# Patient Record
Sex: Male | Born: 1980 | Race: Black or African American | Hispanic: No | Marital: Married | Smoking: Never smoker
Health system: Southern US, Community
[De-identification: ages and names within clinical notes are randomized; demographics above are authoritative.]

## PROBLEM LIST (undated history)

## (undated) DIAGNOSIS — I10 Essential (primary) hypertension: Secondary | ICD-10-CM

---
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
## 2009-06-09 ENCOUNTER — Emergency Department: Payer: Self-pay | Admitting: Emergency Medicine

## 2010-10-09 ENCOUNTER — Emergency Department: Payer: Self-pay | Admitting: Emergency Medicine

## 2012-02-04 IMAGING — US US PELVIS LIMITED
1 series · 17 of 25 positions shown · non-contrast
Comparison: None

REASON FOR EXAM: pain right testicle,  swelling noted  right  scrotal area
COMMENTS:

PROCEDURE:     US  - US TESTICULAR  - June 09, 2009  [DATE]
RESULT:     Indication: Right testicular swelling
TECHNIQUE: Multiple gray-scale, color-flow Doppler, and spectral waveform
tracings of the testicles and testicular vasculature are presented for
review.

[Series 1: us pelvis limited · 17 of 83 slices shown]
[im 1/83]
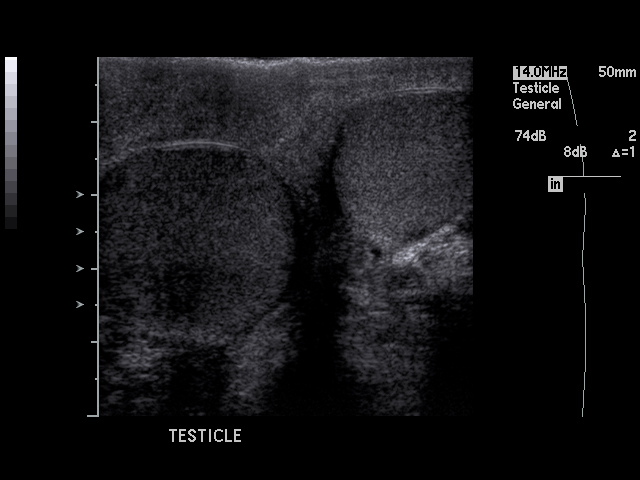
[im 7/83]
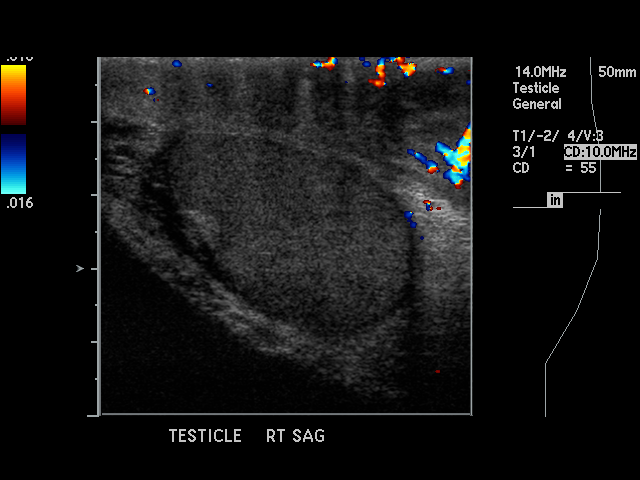
[im 11/83]
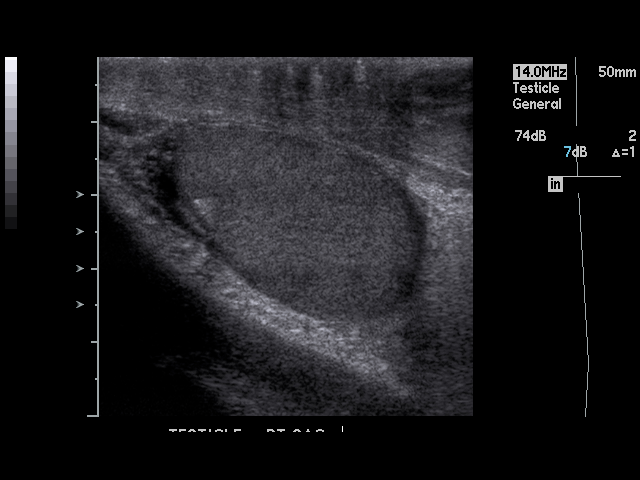
[im 18/83]
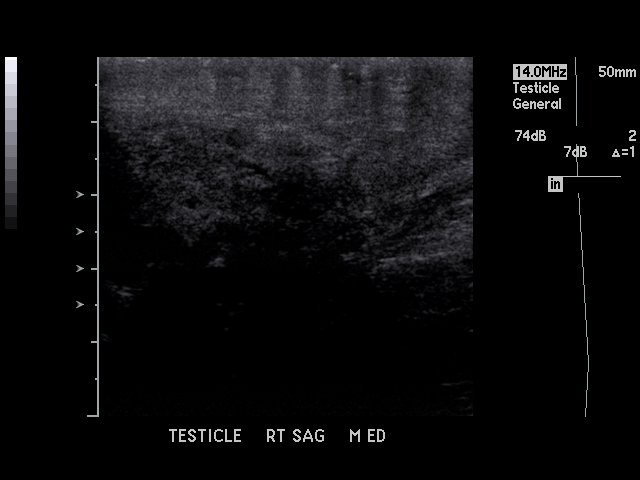
[im 21/83]
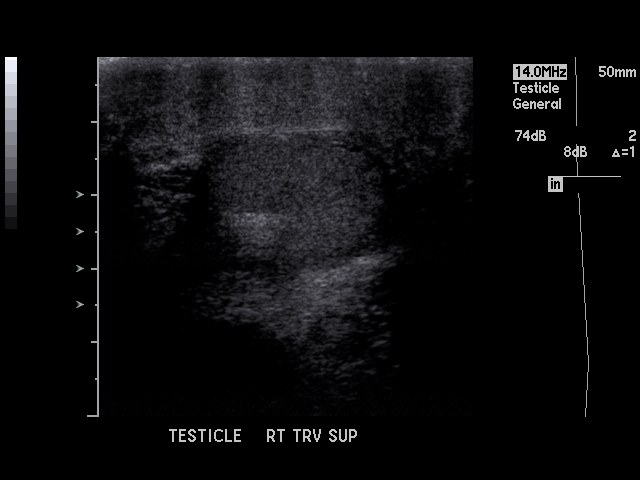
[im 28/83]
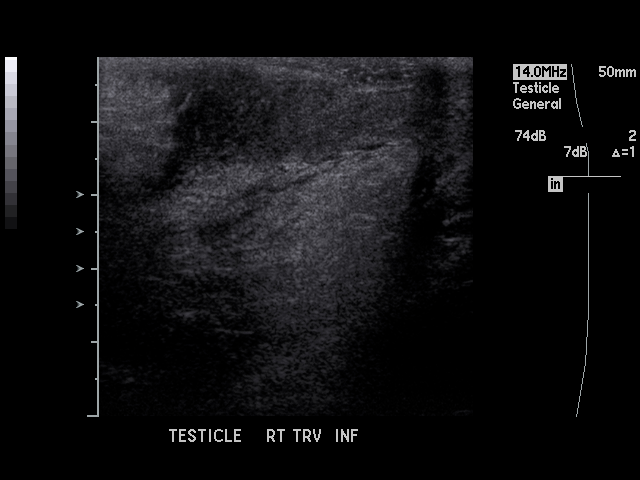
[im 31/83]
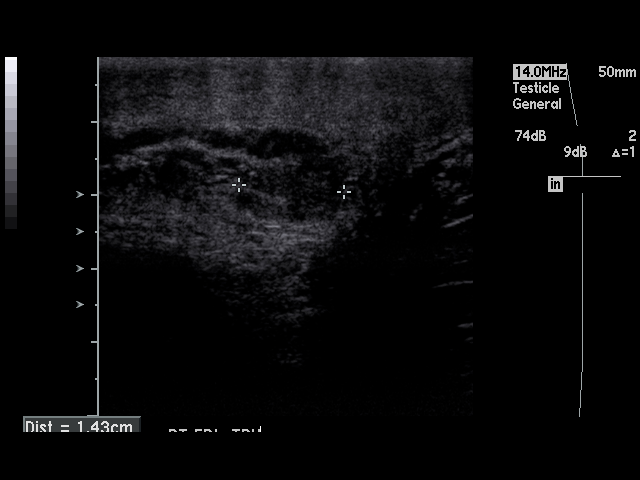
[im 38/83]
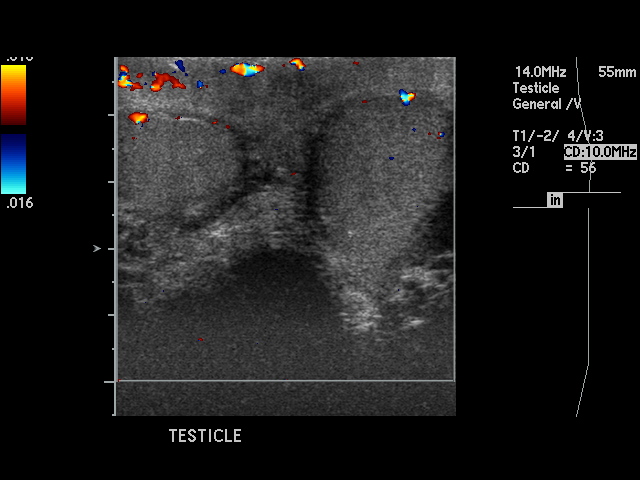
[im 42/83]
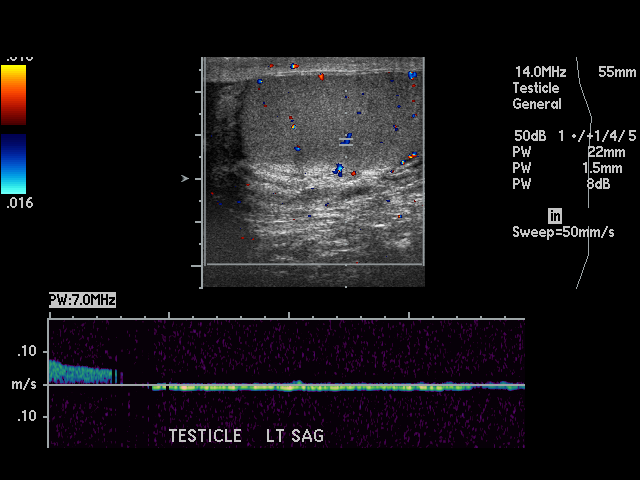
[im 45/83]
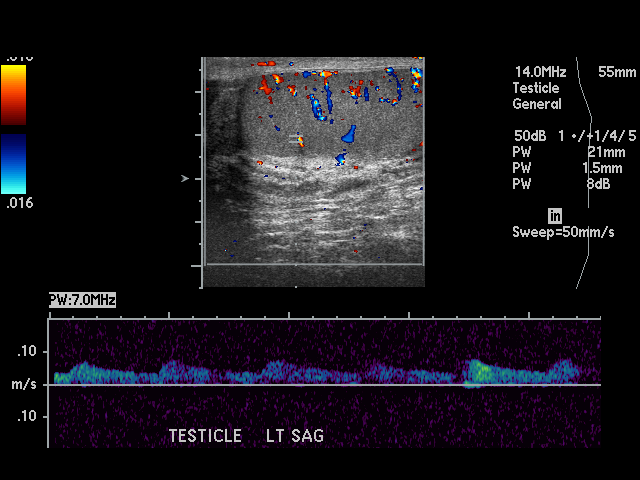
[im 52/83]
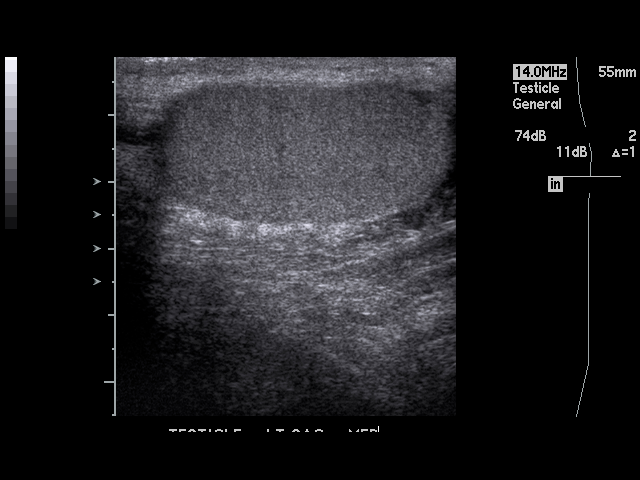
[im 55/83]
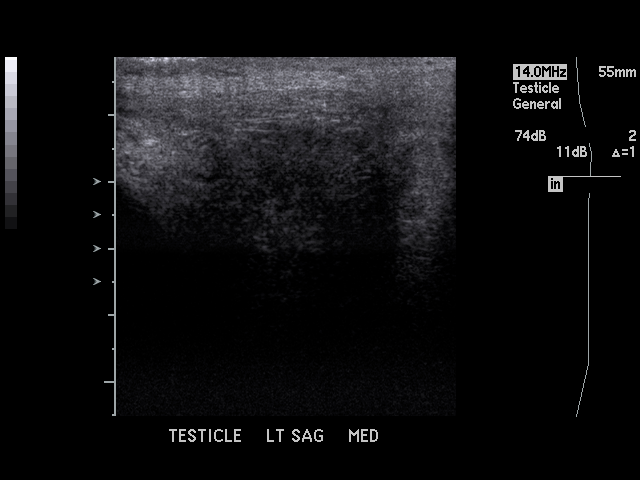
[im 62/83]
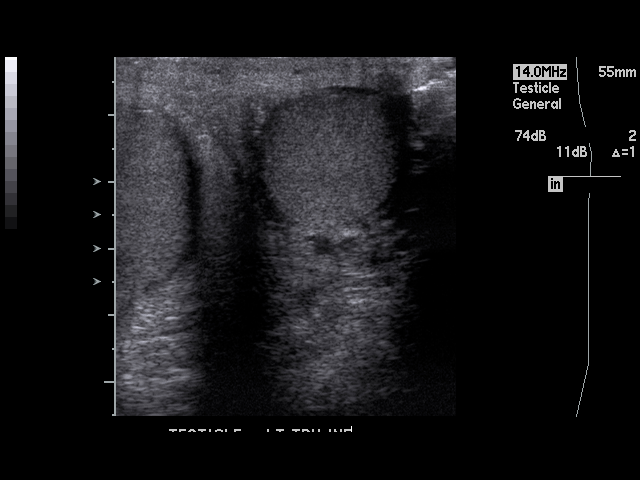
[im 65/83]
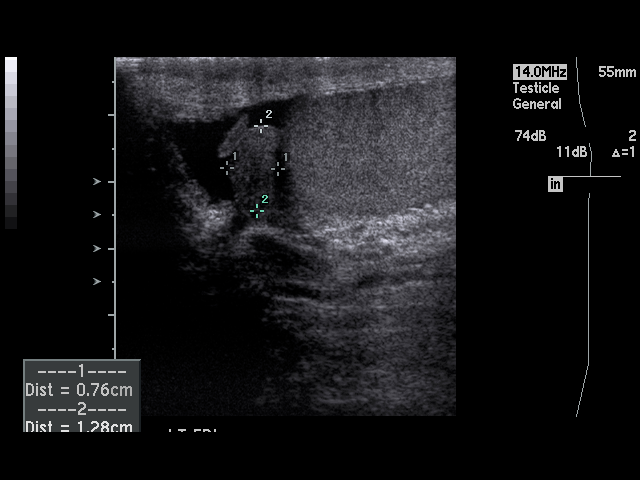
[im 72/83]
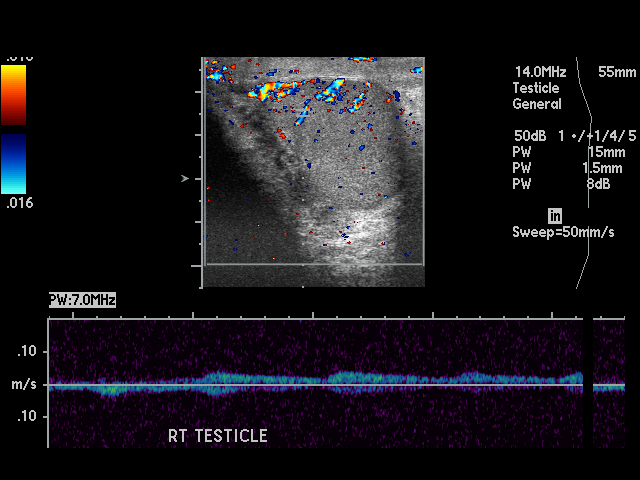
[im 76/83]
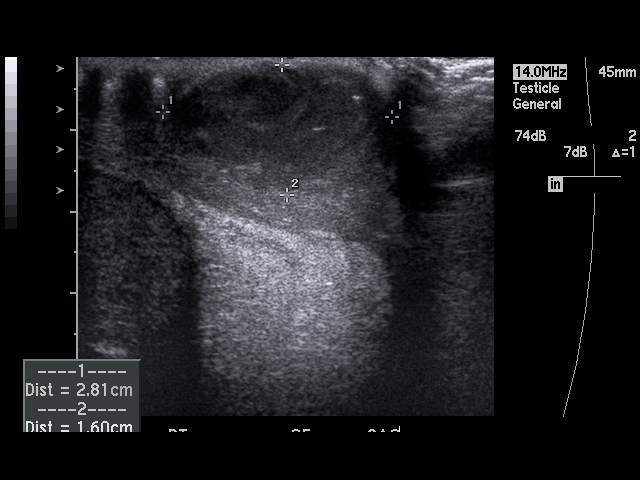
[im 83/83]
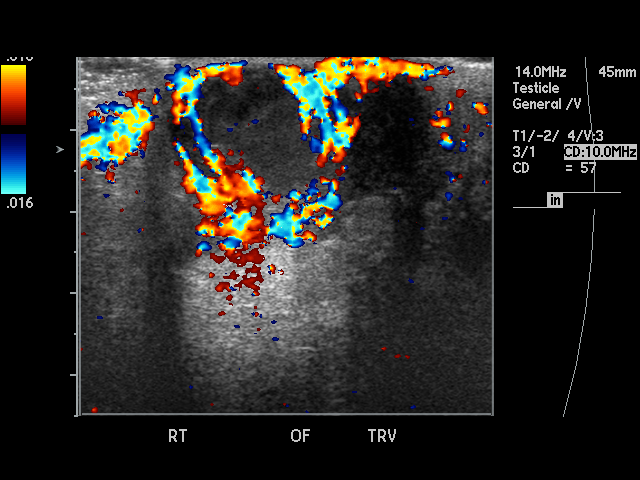

[17 of 25 positions shown; findings below may reference images not displayed]

FINDINGS: The right testicle is of normal echogenicity and measures 4.2 x 2.4 x 3 cm.
No focal mass lesions. There is normal arterial and venous blood flow. The
right epididymal head measures 1.2 x 1.4 x 1.4 cm and is normal in
appearance. No right varicocele or significant hydrocele. Posterior to the
right testicle there is a 2.8 x 1.6 x 2.6 cm heterogeneously hyperechoic
mass with significant peripheral hypervascularity. There is no internal
Doppler flow.

The left testicle is of normal echogenicity and measures 4.5 x 2.4 x 3.1 cm.
No focal mass lesions. There is normal arterial and venous blood flow.  The
left epididymal head measures 0.8 x 1.3 x 1.2 cm and is normal in
appearance. No left varicocele or significant hydrocele.
IMPRESSION: 1. No active testicular torsion.

2. Posterior to the right testicle there is a 2.8 x 1.6 x 2.6 cm
heterogeneously hyperechoic mass with significant peripheral
hypervascularity. There is no internal Doppler flow. The mass appears to be
within the scrotal wall and may represent an abscess versus hematoma .
Recommend urology consultation.

## 2013-06-22 ENCOUNTER — Emergency Department: Payer: Self-pay | Admitting: Emergency Medicine

## 2013-06-22 LAB — URINALYSIS, COMPLETE
BLOOD: NEGATIVE
Bacteria: NONE SEEN
Bilirubin,UR: NEGATIVE
GLUCOSE, UR: NEGATIVE mg/dL (ref 0–75)
Ketone: NEGATIVE
LEUKOCYTE ESTERASE: NEGATIVE
NITRITE: NEGATIVE
PH: 6 (ref 4.5–8.0)
Protein: NEGATIVE
RBC,UR: <1 /HPF (ref 0–5)
SPECIFIC GRAVITY: 1.005 (ref 1.003–1.030)
Squamous Epithelial: NONE SEEN
WBC UR: 2 /HPF (ref 0–5)

## 2013-06-23 LAB — CBC WITH DIFFERENTIAL/PLATELET
BASOS ABS: 0.1 10*3/uL (ref 0.0–0.1)
BASOS PCT: 0.6 %
Eosinophil #: 0.3 10*3/uL (ref 0.0–0.7)
Eosinophil %: 2.5 %
HCT: 43.9 % (ref 40.0–52.0)
HGB: 14.2 g/dL (ref 13.0–18.0)
Lymphocyte #: 2.8 10*3/uL (ref 1.0–3.6)
Lymphocyte %: 26.3 %
MCH: 28.2 pg (ref 26.0–34.0)
MCHC: 32.5 g/dL (ref 32.0–36.0)
MCV: 87 fL (ref 80–100)
MONOS PCT: 8.5 %
Monocyte #: 0.9 x10 3/mm (ref 0.2–1.0)
NEUTROS ABS: 6.6 10*3/uL — AB (ref 1.4–6.5)
NEUTROS PCT: 62.1 %
PLATELETS: 228 10*3/uL (ref 150–440)
RBC: 5.05 10*6/uL (ref 4.40–5.90)
RDW: 13.4 % (ref 11.5–14.5)
WBC: 10.6 10*3/uL (ref 3.8–10.6)

## 2013-06-23 LAB — BASIC METABOLIC PANEL
Anion Gap: 3 — ABNORMAL LOW (ref 7–16)
BUN: 14 mg/dL (ref 7–18)
CALCIUM: 8.9 mg/dL (ref 8.5–10.1)
CO2: 28 mmol/L (ref 21–32)
Chloride: 106 mmol/L (ref 98–107)
Creatinine: 1.14 mg/dL (ref 0.60–1.30)
EGFR (Non-African Amer.): >60
Glucose: 97 mg/dL (ref 65–99)
OSMOLALITY: 274 (ref 275–301)
Potassium: 3.8 mmol/L (ref 3.5–5.1)
Sodium: 137 mmol/L (ref 136–145)

## 2013-06-23 LAB — TROPONIN I

## 2015-03-02 ENCOUNTER — Emergency Department
Admission: EM | Admit: 2015-03-02 | Discharge: 2015-03-02 | Disposition: A | Discharge status: Home / Self Care | Payer: Self-pay | Attending: Emergency Medicine | Admitting: Emergency Medicine

## 2015-03-02 ENCOUNTER — Encounter: Payer: Self-pay | Admitting: Medical Oncology

## 2015-03-02 DIAGNOSIS — K029 Dental caries, unspecified: Secondary | ICD-10-CM | POA: Insufficient documentation

## 2015-03-02 DIAGNOSIS — I10 Essential (primary) hypertension: Secondary | ICD-10-CM | POA: Insufficient documentation

## 2015-03-02 HISTORY — DX: Essential (primary) hypertension: I10

## 2015-03-02 MED ORDER — HYDROCODONE-ACETAMINOPHEN 5-325 MG PO TABS: 1.0000 | ORAL_TABLET | ORAL | Status: AC | PRN

## 2015-03-02 MED ORDER — AMOXICILLIN 500 MG PO TABS: 500.0000 mg | ORAL_TABLET | Freq: Two times a day (BID) | ORAL | Status: AC

## 2015-03-02 MED ORDER — IBUPROFEN 800 MG PO TABS: 800.0000 mg | ORAL_TABLET | Freq: Three times a day (TID) | ORAL | Status: DC | PRN

## 2015-03-02 NOTE — ED Notes (Signed)
States he woke up this afternoon to get ready for work and developed pain to right side of mouth ..unsure if pain is from upper gum or lower ..states he has a hole in one of his teeth

## 2015-03-02 NOTE — Discharge Instructions (Signed)
OPTIONS FOR DENTAL FOLLOW UP CARE ° °[place] [facility] and [facility] - Local Safety Net Dental Clinics °http://www.ncdhhs.gov/dph/oralhealth/services/safetynetclinics.htm °  °[facility] (336-562-3123) ° °[facility] [place] (919-933-9087) ° °[facility] ([phone] ext [phone]) ° °[facility] ([phone]) ° °[facility] ([phone]) °This clinic caters to the indigent population and is on a lottery system. °Location: °[facility], [address], [place] °Clinic Hours: °Wednesdays from 6pm - 9pm, patients seen by a lottery system. °For dates, call or go to [email or web address].[facility].edu/[facility]/patients/Dental-[facility] °Services: °Cleanings, fillings and simple extractions. °Payment Options: °DENTAL WORK IS FREE OF CHARGE. Bring proof of income or support. °Best way to get seen: °Arrive at 5:15 pm - this is a lottery, NOT first come/first serve, so arriving earlier will not increase your chances of being seen. °  °  °[facility] °[phone] °Select option 1 for emergencies °  °Location: °[facility], [address], [place] °Clinic Hours: °No walk-ins accepted - call the day before to schedule an appointment. °Check in times are 9:30 am and 1:30 pm. °Services: °Simple extractions, temporary fillings, pulpectomy/pulp debridement, uncomplicated abscess drainage. °Payment Options: °PAYMENT IS DUE AT THE TIME OF SERVICE.  Fee is usually $100-200, additional surgical procedures (e.g. abscess drainage) may be extra. °Cash, checks, Visa/MasterCard accepted.  Can file Medicaid if patient is covered for dental - patient should call case worker to check. °No discount for [facility] patients. °Best way to get seen: °MUST call the day before and get onto the schedule. Can usually be seen the next 1-2 days. No walk-ins accepted. °  °  °[facility] °[phone] °   °Location: °[facility], [address], [place] °Clinic Hours: °M, W, Th, F 8am or 1:30pm, Tues 9a or 1:30 - first come/first served. °Services: °Simple extractions, temporary fillings, uncomplicated abscess drainage.  You do not need to be an [place] resident. °Payment Options: °PAYMENT IS DUE AT THE TIME OF SERVICE. °Dental insurance, otherwise sliding scale - bring proof of income or support. °Depending on income and treatment needed, cost is usually $50-200. °Best way to get seen: °Arrive early as it is first come/first served. °  °  °[facility] °[phone] °  °Location: °[address] °Clinic Hours: °Mon-Thu 8a-5p °Services: °Most basic dental services including extractions and fillings. °Payment Options: °PAYMENT IS DUE AT THE TIME OF SERVICE. °Sliding scale, up to 50% off - bring proof if income or support. °Medicaid with dental option accepted. °Best way to get seen: °Call to schedule an appointment, can usually be seen within 2 weeks OR they will try to see walk-ins - show up at 8a or 2p (you may have to wait). °  °  °[facility] °[phone] °[place] RESIDENTS ONLY °  °Location: °[facility], [address], [place],  [address] °Clinic Hours: By appointment only. °Monday - Thursday 8am-5pm, Friday 8am-12pm °Services: Cleanings, fillings, extractions. °Payment Options: °PAYMENT IS DUE AT THE TIME OF SERVICE. °Cash, Visa or MasterCard. Sliding scale - $30 minimum per service. °Best way to get seen: °Come in to office, complete packet and make an appointment - need proof of income °or support monies for each household member and proof of [place] residence. °Usually takes about a month to get in. °  °  °[facility] °[phone] °  °Location: °[address]   [place] °Clinic Hours: Walk-in Urgent Care Dental Services are offered Monday-Friday  mornings only. °The numbers of emergencies accepted daily is limited to the number of °providers available. °Maximum 15 - Mondays, Wednesdays & Thursdays °Maximum 10 - Tuesdays & Fridays °Services: °You do not need to be a [place] resident to be seen for a dental emergency. °Emergencies are defined as pain, swelling, abnormal bleeding, or dental trauma. Walkins will receive x-rays if needed. °NOTE: Dental cleaning is not an emergency. °Payment Options: °PAYMENT IS DUE AT THE TIME OF SERVICE. °Minimum co-pay is $40.00 for uninsured patients. °Minimum co-pay is $3.00 for Medicaid with dental coverage. °Dental Insurance is accepted and must be presented at time of visit. °Medicare does not cover dental. °Forms of payment: Cash, credit card, checks. °Best way to get seen: °If not previously registered with the clinic, walk-in dental registration begins at 7:15 am and is on a first come/first serve basis. °If previously registered with the clinic, call to make an appointment. °  °  °[facility] °[phone] °[place] RESIDENTS ONLY °  °Location: °[address], [place], [place] °Clinic Hours: °Mon-Thu 10a-2p °Services: Extractions only! °Payment Options: °FREE (donations accepted) - bring proof of income or support °Best way to get seen: °Call and schedule an appointment OR come at 8am on the 1st Monday of every month (except for holidays) when it is first come/first served. °  °  °[facility] °[phone] °  °Location: °[address], [place] °Clinic Hours: °Friday mornings °Services, Payment Options, Best way to get seen: °Call for info ° ° ° °Dental Caries °Dental caries is tooth decay. This decay can cause a hole in teeth (cavity) that can get bigger and deeper over time. °HOME CARE °· Brush and floss your teeth. Do this at least two times a day. °· Use a fluoride toothpaste. °· Use a mouth rinse if told by your dentist or doctor. °· Eat less sugary and starchy foods. Drink less sugary  drinks. °· Avoid snacking often on sugary and starchy foods. Avoid sipping often on sugary drinks. °· Keep regular checkups and cleanings with your dentist. °· Use fluoride supplements if told by your dentist or doctor. °· Allow fluoride to be applied to teeth if told by your dentist or doctor. °  °This information is not intended to replace advice given to you by your health care provider. Make sure you discuss any questions you have with your health care provider. °  °Document Released: 10/19/2007 Document Revised: 01/30/2014 Document Reviewed: 01/12/2012 °Elsevier Interactive Patient Education ©2016 Elsevier Inc. ° °

## 2015-03-02 NOTE — ED Notes (Signed)
Pt states that he began last night with dental pain that has now gave him a headache.

## 2015-03-02 NOTE — ED Provider Notes (Signed)
[facility] Emergency Department Provider Note  ____________________________________________  Time seen: Approximately 5:30 PM  I have reviewed the triage vital signs and the nursing notes.   HISTORY  Chief Complaint Dental Pain    HPI Steve Bell is a 35 y.o. male presents for evaluation of right upper and lower dental pain. Patient states he started last night but woke up this morning unable to work secondary to the pain. Patient reports unable to go to work today secondary to the toothache.   Past Medical History  Diagnosis Date  . Hypertension     There are no active problems to display for this patient.   History reviewed. No pertinent past surgical history.  Current Outpatient Rx  Name  Route  Sig  Dispense  Refill  . amoxicillin (AMOXIL) 500 MG tablet   Oral   Take 1 tablet (500 mg total) by mouth 2 (two) times daily.   20 tablet   0   . HYDROcodone-acetaminophen (NORCO) 5-325 MG tablet   Oral   Take 1-2 tablets by mouth every 4 (four) hours as needed for moderate pain.   8 tablet   0   . ibuprofen (ADVIL,MOTRIN) 800 MG tablet   Oral   Take 1 tablet (800 mg total) by mouth every 8 (eight) hours as needed.   30 tablet   0     Allergies Review of patient's allergies indicates no known allergies.  No family history on file.  Social History Social History  Substance Use Topics  . Smoking status: Never Smoker   . Smokeless tobacco: None  . Alcohol Use: None    Review of Systems Constitutional: No fever/chills Eyes: No visual changes. ENT: No sore throat. Positive for dental pain. Cardiovascular: Denies chest pain. Respiratory: Denies shortness of breath. Gastrointestinal: No abdominal pain.  No nausea, no vomiting.  No diarrhea.  No constipation. Genitourinary: Negative for dysuria. Musculoskeletal: Negative for back pain. Skin: Negative for rash. Neurological: Negative for headaches, focal weakness or  numbness.  10-point ROS otherwise negative.  ____________________________________________   PHYSICAL EXAM:  VITAL SIGNS: ED Triage Vitals  Enc Vitals Group     BP 03/02/15 1649 160/100 mmHg     Pulse Rate 03/02/15 1649 104     Resp 03/02/15 1649 18     Temp 03/02/15 1649 97.5 F (36.4 C)     Temp Source 03/02/15 1649 Oral     SpO2 03/02/15 1649 96 %     Weight 03/02/15 1649 290 lb (131.543 kg)     Height 03/02/15 1649  (1.778 m)     Head Cir --      Peak Flow --      Pain Score 03/02/15 1649 10     Pain Loc --      Pain Edu? --      Excl. in GC? --     Constitutional: Alert and oriented. Well appearing and in no acute distress. Mouth/Throat: Mucous membranes are moist.  Oropharynx non-erythematous. Positive dental caries and swollen inflammation) right lower gum Neck: No stridor.   Cardiovascular: Normal rate, regular rhythm. Grossly normal heart sounds.  Good peripheral circulation. Respiratory: Normal respiratory effort.  No retractions. Lungs CTAB. Musculoskeletal: No lower extremity tenderness nor edema.  No joint effusions. Neurologic:  Normal speech and language. No gross focal neurologic deficits are appreciated. No gait instability. Skin:  Skin is warm, dry and intact. No rash noted. Psychiatric: Mood and affect are normal. Speech and behavior  are normal.  ____________________________________________   LABS (all labs ordered are listed, but only abnormal results are displayed)  Labs Reviewed - No data to display    PROCEDURES  Procedure(s) performed: None  Critical Care performed: No  ____________________________________________   INITIAL IMPRESSION / ASSESSMENT AND PLAN / ED COURSE  Pertinent labs & imaging results that were available during my care of the patient were reviewed by me and considered in my medical decision making (see chart for details).  Acute dental caries with abscess. Rx given for amoxicillin 500 mg 3 times a day, Motrin  800 mg 3 times a day. Patient to follow-up with him attached list of dentists. Excuse 1 day given. ____________________________________________   FINAL CLINICAL IMPRESSION(S) / ED DIAGNOSES  Final diagnoses:  Pain due to dental caries     This chart was dictated using voice recognition software/Dragon. Despite best efforts to proofread, errors can occur which can change the meaning. Any change was purely unintentional.   Evangeline Dakin, PA-C 03/02/15 1739  Arnaldo Natal, MD 03/13/15 [phone]

## 2015-06-01 ENCOUNTER — Encounter: Payer: Self-pay | Admitting: Emergency Medicine

## 2015-06-01 ENCOUNTER — Emergency Department
Admission: EM | Admit: 2015-06-01 | Discharge: 2015-06-01 | Disposition: A | Discharge status: Home / Self Care | Payer: Self-pay | Attending: Emergency Medicine | Admitting: Emergency Medicine

## 2015-06-01 DIAGNOSIS — Z792 Long term (current) use of antibiotics: Secondary | ICD-10-CM | POA: Insufficient documentation

## 2015-06-01 DIAGNOSIS — S00451A Superficial foreign body of right ear, initial encounter: Secondary | ICD-10-CM

## 2015-06-01 DIAGNOSIS — Y999 Unspecified external cause status: Secondary | ICD-10-CM | POA: Insufficient documentation

## 2015-06-01 DIAGNOSIS — Y929 Unspecified place or not applicable: Secondary | ICD-10-CM | POA: Insufficient documentation

## 2015-06-01 DIAGNOSIS — I10 Essential (primary) hypertension: Secondary | ICD-10-CM | POA: Insufficient documentation

## 2015-06-01 DIAGNOSIS — T161XXA Foreign body in right ear, initial encounter: Secondary | ICD-10-CM | POA: Insufficient documentation

## 2015-06-01 DIAGNOSIS — Y939 Activity, unspecified: Secondary | ICD-10-CM | POA: Insufficient documentation

## 2015-06-01 DIAGNOSIS — X58XXXA Exposure to other specified factors, initial encounter: Secondary | ICD-10-CM | POA: Insufficient documentation

## 2015-06-01 NOTE — Discharge Instructions (Signed)
Ear Foreign Body °An ear foreign body is an object that is stuck in your ear. The object is usually stuck in the ear canal. °CAUSES °In all ages of people, the most common foreign bodies are insects that enter the ear canal. It is common for young children to put objects into the ear canal. These may include pebbles, beads, parts of toys, and any other small objects that fit into the ear. In adults, objects such as cotton swabs may become lodged in the ear canal.  °SIGNS AND SYMPTOMS °A foreign body in the ear may cause: °· Pain. °· Buzzing or roaring sounds. °· Hearing loss. °· Ear drainage or bleeding. °· Nausea and vomiting. °· A feeling that your ear is full. °DIAGNOSIS °Your health care provider may be able to diagnose an ear foreign body based on the information that you provide, your symptoms, and a physical exam. Your health care provider may also perform tests, such as testing your hearing and your ear pressure, to check for infection or other problems that are caused by the foreign body in your ear. °TREATMENT °Treatment depends on what the foreign body is, the location of the foreign body in your ear, and whether or not the foreign body has injured any part of your inner ear. If the foreign body is visible to your health care provider, it may be possible to remove the foreign body using: °· A tool, such as medical tweezers (forceps) or a suction tube (catheter). °· Irrigation. This uses water to flush the foreign body out of your ear. This is used only if the foreign body is not likely to swell or enlarge when it is put in water. °If the foreign body is not visible or your health care provider was not able to remove the foreign body, you may be referred to a specialist for removal. You may also be prescribed antibiotic medicine or ear drops to prevent infection. If the foreign body has caused injury to other parts of your ear, you may need additional treatment. °HOME CARE INSTRUCTIONS °· Keep all  follow-up visits as directed by your health care provider. This is important. °· Take medicines only as directed by your health care provider. °· If you were prescribed an antibiotic medicine, finish it all even if you start to feel better. °PREVENTION °· Keep small objects out of reach of young children. Tell children not to put anything in their ears. °· Do not put anything in your ear, including cotton swabs, to clean your ears. Talk to your health care provider about how to clean your ears safely. °SEEK MEDICAL CARE IF: °· You have a headache. °· Your have blood coming from your ear. °· You have a fever. °· You have increased pain or swelling of your ear. °· Your hearing is reduced. °· You have discharge coming from your ear. °  °This information is not intended to replace advice given to you by your health care provider. Make sure you discuss any questions you have with your health care provider. °  °Document Released: 01/07/2000 Document Revised: 01/30/2014 Document Reviewed: 08/25/2013 °Elsevier Interactive Patient Education ©2016 Elsevier Inc. ° °

## 2015-06-01 NOTE — ED Notes (Signed)
States a part of a qtip got stuck in right ear

## 2015-06-01 NOTE — ED Notes (Signed)
See triage note  States he was cleaning out his ear this am  And lost a piece of Q tip in right ear

## 2015-06-01 NOTE — ED Provider Notes (Signed)
[facility] Emergency Department Provider Note  ____________________________________________  Time seen: Approximately 9:36 AM  I have reviewed the triage vital signs and the nursing notes.   HISTORY  Chief Complaint Foreign Body in Ear   HPI Steve Bell is a 35 y.o. male, NAD, presents to the emergency department today with the tip of a q-tip in his right ear. He states it happened this morning while cleaning his ears. Denies other injury at this time.  Past Medical History  Diagnosis Date  . Hypertension     There are no active problems to display for this patient.   History reviewed. No pertinent past surgical history.  Current Outpatient Rx  Name  Route  Sig  Dispense  Refill  . amoxicillin (AMOXIL) 500 MG tablet   Oral   Take 1 tablet (500 mg total) by mouth 2 (two) times daily.   20 tablet   0   . HYDROcodone-acetaminophen (NORCO) 5-325 MG tablet   Oral   Take 1-2 tablets by mouth every 4 (four) hours as needed for moderate pain.   8 tablet   0   . ibuprofen (ADVIL,MOTRIN) 800 MG tablet   Oral   Take 1 tablet (800 mg total) by mouth every 8 (eight) hours as needed.   30 tablet   0     Allergies Review of patient's allergies indicates no known allergies.  No family history on file.  Social History Social History  Substance Use Topics  . Smoking status: Never Smoker   . Smokeless tobacco: None  . Alcohol Use: None    Review of Systems Constitutional: No fever/chills ENT: No sore throat. Positive right ear pain. Cardiovascular: Denies chest pain. Respiratory: Denies shortness of breath. Genitourinary: Negative for dysuria. Musculoskeletal: Negative for back pain. Skin: Negative for rash. Neurological: Negative for headaches, focal weakness or numbness.  10-point ROS otherwise negative.  ____________________________________________   PHYSICAL EXAM:  VITAL SIGNS: ED Triage Vitals  Enc Vitals Group   BP 06/01/15 0916 152/105 mmHg     Pulse Rate 06/01/15 0916 85     Resp 06/01/15 0916 18     Temp 06/01/15 0916 97.9 F (36.6 C)     Temp Source 06/01/15 0916 Oral     SpO2 06/01/15 0916 99 %     Weight 06/01/15 0916 300 lb (136.079 kg)     Height 06/01/15 0916  (1.753 m)     Head Cir --      Peak Flow --      Pain Score 06/01/15 0911 1     Pain Loc --      Pain Edu? --      Excl. in GC? --    Constitutional: Alert and oriented. Well appearing and in no acute distress. Eyes: Conjunctivae are normal. PERRL. EOMI. Ears: Obvious white foreign body in right ear canal. Left ear TM visualized without foreign body, normal light reflex, non-erythematous. Head: Atraumatic. Nose: No congestion/rhinnorhea. Mouth/Throat: Mucous membranes are moist.  Oropharynx non-erythematous. Cardiovascular: Good peripheral circulation. Respiratory: Normal respiratory effort.  No retractions. Musculoskeletal: No lower extremity tenderness nor edema.  No joint effusions. Neurologic:  Normal speech and language. No gross focal neurologic deficits are appreciated. No gait instability. Skin:  Skin is warm, dry and intact. No rash noted. Psychiatric: Mood and affect are normal. Speech and behavior are normal. ___________________________________________   PROCEDURES  Procedure(s) performed: Foreign body removed. Tip of q-tip was fully removed from the right ear with  alligator clamp.  Critical Care performed: No  ____________________________________________   INITIAL IMPRESSION / ASSESSMENT AND PLAN / ED COURSE  Pertinent labs & imaging results that were available during my care of the patient were reviewed by me and considered in my medical decision making (see chart for details).  Foreign body in right ear. The tip of the q-tip was successfully removed from the right inner ear. Advised to not use q-tips to clean the inner ear.  ____________________________________________   FINAL CLINICAL  IMPRESSION(S) / ED DIAGNOSES  Final diagnoses:  Foreign body in ear lobe, right, initial encounter      NEW MEDICATIONS STARTED DURING THIS VISIT:  Discharge Medication List as of 06/01/2015  9:40 AM       Note:  This document was prepared using Dragon voice recognition software and may include unintentional dictation errors.    Tommi Rumpshonda L Shell Yandow, PA-C 06/01/15 1025

## 2017-03-26 ENCOUNTER — Emergency Department
Admission: EM | Admit: 2017-03-26 | Discharge: 2017-03-26 | Disposition: A | Discharge status: Home / Self Care | Payer: BLUE CROSS/BLUE SHIELD | Attending: Emergency Medicine | Admitting: Emergency Medicine

## 2017-03-26 ENCOUNTER — Encounter: Payer: Self-pay | Admitting: Emergency Medicine

## 2017-03-26 ENCOUNTER — Other Ambulatory Visit: Payer: Self-pay

## 2017-03-26 ENCOUNTER — Emergency Department: Payer: BLUE CROSS/BLUE SHIELD

## 2017-03-26 DIAGNOSIS — I10 Essential (primary) hypertension: Secondary | ICD-10-CM | POA: Diagnosis not present

## 2017-03-26 DIAGNOSIS — M25552 Pain in left hip: Secondary | ICD-10-CM | POA: Diagnosis present

## 2017-03-26 DIAGNOSIS — Y939 Activity, unspecified: Secondary | ICD-10-CM | POA: Insufficient documentation

## 2017-03-26 DIAGNOSIS — Z79899 Other long term (current) drug therapy: Secondary | ICD-10-CM | POA: Diagnosis not present

## 2017-03-26 DIAGNOSIS — M7062 Trochanteric bursitis, left hip: Secondary | ICD-10-CM | POA: Diagnosis not present

## 2017-03-26 MED ORDER — MELOXICAM 15 MG PO TABS: 15.0000 mg | ORAL_TABLET | Freq: Every day | ORAL | 1 refills | Status: AC

## 2017-03-26 NOTE — ED Triage Notes (Signed)
Pt c/o left hip pain worse when bends over.  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 Also will cater to this leg with his plantar fascitis flares up.  Ambulatory without difficulty. Has tried motrin OTC but has not helped pain.

## 2017-03-26 NOTE — ED Provider Notes (Signed)
[facility] Emergency Department Provider Note  ____________________________________________  Time seen: Approximately 5:37 PM  I have reviewed the triage vital signs and the nursing notes.   HISTORY  Chief Complaint Hip Pain    HPI Steve Bell is a 37 y.o. male presents to the emergency department with reproducible tenderness over the left trochanteric bursa and overall left hip pain for the past 3 days.  Patient reports that he has been taking ibuprofen and Aleve, which mildly improves his symptoms.  He denies falls or traumas.  He denies pain in the groin.  Patient reports that he does repetitive physical activities in a flat plane.  Past Medical History:  Diagnosis Date  . Hypertension     There are no active problems to display for this patient.   History reviewed. No pertinent surgical history.  Prior to Admission medications   Medication Sig Start Date End Date Taking? Authorizing Provider  amLODipine (NORVASC) 5 MG tablet Take 5 mg by mouth daily.   Yes [provider]  amoxicillin (AMOXIL) 500 MG tablet Take 1 tablet (500 mg total) by mouth 2 (two) times daily. 03/02/15   Beers, Charmayne Sheerharles M, PA-C  HYDROcodone-acetaminophen (NORCO) 5-325 MG tablet Take 1-2 tablets by mouth every 4 (four) hours as needed for moderate pain. 03/02/15   Beers, Charmayne Sheerharles M, PA-C  ibuprofen (ADVIL,MOTRIN) 800 MG tablet Take 1 tablet (800 mg total) by mouth every 8 (eight) hours as needed. 03/02/15   Beers, Charmayne Sheerharles M, PA-C  meloxicam (MOBIC) 15 MG tablet Take 1 tablet (15 mg total) by mouth daily for 7 days. 03/26/17 04/02/17  Orvil FeilWoods, Devetta Hagenow M, PA-C    Allergies Patient has no known allergies.  History reviewed. No pertinent family history.  Social History Social History   Tobacco Use  . Smoking status: Never Smoker  . Smokeless tobacco: Never Used  Substance Use Topics  . Alcohol use: Yes    Frequency: Never  . Drug use: No     Review of Systems   Constitutional: No fever/chills Eyes: No visual changes. No discharge ENT: No upper respiratory complaints. Cardiovascular: no chest pain. Respiratory: no cough. No SOB. Gastrointestinal: No abdominal pain.  No nausea, no vomiting.  No diarrhea.  No constipation.   Musculoskeletal: Patient has left hip pain. Skin: Negative for rash, abrasions, lacerations, ecchymosis. Neurological: Negative for headaches, focal weakness or numbness.   ____________________________________________   PHYSICAL EXAM:  VITAL SIGNS: ED Triage Vitals  Enc Vitals Group     BP 03/26/17 1641 (!) 158/95     Pulse Rate 03/26/17 1641 (!) 115     Resp 03/26/17 1641 20     Temp 03/26/17 1641 98.7 F (37.1 C)     Temp Source 03/26/17 1641 Oral     SpO2 03/26/17 1641 98 %     Weight 03/26/17 1642 293 lb (132.9 kg)     Height 03/26/17 1642 5\' 9"  (1.753 m)     Head Circumference --      Peak Flow --      Pain Score 03/26/17 1642 7     Pain Loc --      Pain Edu? --      Excl. in GC? --      Constitutional: Alert and oriented. Well appearing and in no acute distress. Eyes: Conjunctivae are normal. PERRL. EOMI. Head: Atraumatic. Cardiovascular: Normal rate, regular rhythm. Normal S1 and S2.  Good peripheral circulation. Respiratory: Normal respiratory effort without tachypnea or retractions. Lungs CTAB.  Good air entry to the bases with no decreased or absent breath sounds. Musculoskeletal: Full range of motion to all extremities. No gross deformities appreciated.  Patient has reproducible tenderness elicited with palpation over the left trochanteric bursa. Neurologic:  Normal speech and language. No gross focal neurologic deficits are appreciated.  Skin:  Skin is warm, dry and intact. No rash noted.   ____________________________________________   LABS (all labs ordered are listed, but only abnormal results are displayed)  Labs Reviewed - No data to  display ____________________________________________  EKG   ____________________________________________  RADIOLOGY Geraldo Pitter, personally viewed and evaluated these images (plain radiographs) as part of my medical decision making, as well as reviewing the written report by the radiologist.  Dg Hip Unilat With Pelvis 2-3 Views Left  Result Date: 03/26/2017 CLINICAL DATA:  Left hip pain without trauma. EXAM: DG HIP (WITH OR WITHOUT PELVIS) 2-3V LEFT COMPARISON:  None. FINDINGS: AP view the pelvis and AP/frog leg views of the left hip. Femoral heads are located. Sacroiliac joints are symmetric. No acute fracture. No focal osseous lesion. Joint spaces maintained. IMPRESSION: No acute osseous abnormality. Electronically Signed   By: Jeronimo Greaves M.D.   On: 03/26/2017 17:12    ____________________________________________    PROCEDURES  Procedure(s) performed:    Procedures    Medications - No data to display   ____________________________________________   INITIAL IMPRESSION / ASSESSMENT AND PLAN / ED COURSE  Pertinent labs & imaging results that were available during my care of the patient were reviewed by me and considered in my medical decision making (see chart for details).  Review of the [place] CSRS was performed in accordance of the [facility] prior to dispensing any controlled drugs.     Assessment and plan Trochanteric bursitis Patient presents to the emergency department with reproducible pain to palpation over the left trochanteric bursa.  Original differential diagnosis for left hip pain included avascular necrosis of the femoral head, fracture, trochanteric bursitis and osteoarthritis.  History and physical exam findings are consistent with trochanteric bursitis at this time.  Mobic was prescribed at discharge and ice was recommended.  Patient was advised to follow-up with orthopedics for possible cortisone injection in the trochanteric bursa.  Patient voiced  understanding.  Vital signs were reassuring aside from hypertension.  All patient questions were answered.  ____________________________________________  FINAL CLINICAL IMPRESSION(S) / ED DIAGNOSES  Final diagnoses:  Trochanteric bursitis of left hip      NEW MEDICATIONS STARTED DURING THIS VISIT:  ED Discharge Orders        Ordered    meloxicam (MOBIC) 15 MG tablet  Daily     03/26/17 1731          This chart was dictated using voice recognition software/Dragon. Despite best efforts to proofread, errors can occur which can change the meaning. Any change was purely unintentional.    Orvil Feil, PA-C 03/26/17 1741    Rockne Menghini, MD 03/26/17 [phone]

## 2017-03-26 NOTE — ED Notes (Signed)
See triage note  Presents with left hip pain  States pain started on Friday  Then this weekend pain moved into left leg  Ambulates slowly and with sl limp d/t pain  Denies any injury

## 2017-05-01 ENCOUNTER — Other Ambulatory Visit: Payer: Self-pay

## 2017-05-01 ENCOUNTER — Emergency Department
Admission: EM | Admit: 2017-05-01 | Discharge: 2017-05-01 | Disposition: A | Discharge status: Home / Self Care | Payer: BLUE CROSS/BLUE SHIELD | Attending: Emergency Medicine | Admitting: Emergency Medicine

## 2017-05-01 DIAGNOSIS — W298XXA Contact with other powered powered hand tools and household machinery, initial encounter: Secondary | ICD-10-CM | POA: Insufficient documentation

## 2017-05-01 DIAGNOSIS — I10 Essential (primary) hypertension: Secondary | ICD-10-CM | POA: Diagnosis not present

## 2017-05-01 DIAGNOSIS — Z79899 Other long term (current) drug therapy: Secondary | ICD-10-CM | POA: Diagnosis not present

## 2017-05-01 DIAGNOSIS — Z23 Encounter for immunization: Secondary | ICD-10-CM | POA: Diagnosis not present

## 2017-05-01 DIAGNOSIS — S61012A Laceration without foreign body of left thumb without damage to nail, initial encounter: Secondary | ICD-10-CM | POA: Insufficient documentation

## 2017-05-01 DIAGNOSIS — Y999 Unspecified external cause status: Secondary | ICD-10-CM | POA: Insufficient documentation

## 2017-05-01 DIAGNOSIS — Y929 Unspecified place or not applicable: Secondary | ICD-10-CM | POA: Insufficient documentation

## 2017-05-01 DIAGNOSIS — Y93H2 Activity, gardening and landscaping: Secondary | ICD-10-CM | POA: Diagnosis not present

## 2017-05-01 MED ORDER — TETANUS-DIPHTH-ACELL PERTUSSIS 5-2.5-18.5 LF-MCG/0.5 IM SUSP
0.5000 mL | Freq: Once | INTRAMUSCULAR | Status: AC
Start: 2017-05-01 — End: 2017-05-01
  Administered 2017-05-01: 0.5 mL via INTRAMUSCULAR
  Filled 2017-05-01: qty 0.5

## 2017-05-01 MED ORDER — BACITRACIN ZINC 500 UNIT/GM EX OINT
TOPICAL_OINTMENT | CUTANEOUS | Status: AC
  Administered 2017-05-01: 1
  Filled 2017-05-01: qty 0.9

## 2017-05-01 MED ORDER — LIDOCAINE HCL (PF) 1 % IJ SOLN
INTRAMUSCULAR | Status: AC
  Filled 2017-05-01: qty 5

## 2017-05-01 MED ORDER — OXYCODONE-ACETAMINOPHEN 5-325 MG PO TABS
1.0000 | ORAL_TABLET | Freq: Once | ORAL | Status: AC
  Administered 2017-05-01: 1 via ORAL
  Filled 2017-05-01: qty 1

## 2017-05-01 MED ORDER — CEPHALEXIN 500 MG PO CAPS: 500.0000 mg | ORAL_CAPSULE | Freq: Four times a day (QID) | ORAL | 0 refills | Status: AC

## 2017-05-01 MED ORDER — IBUPROFEN 800 MG PO TABS: 800.0000 mg | ORAL_TABLET | Freq: Three times a day (TID) | ORAL | 0 refills | Status: AC | PRN

## 2017-05-01 MED ORDER — LIDOCAINE HCL (PF) 1 % IJ SOLN
5.0000 mL | Freq: Once | INTRAMUSCULAR | Status: AC
  Administered 2017-05-01: 5 mL via INTRADERMAL
  Filled 2017-05-01: qty 5

## 2017-05-01 NOTE — ED Notes (Signed)
Pt. Verbalizes understanding of d/c instructions, medications, and follow-up.  Pt. In NAD at time of d/c and denies further concerns regarding this visit. Pt. Stable at the time of departure from the unit, departing unit by the safest and most appropriate manner per that pt condition and limitations with all belongings accounted for. Pt advised to return to the ED at any time for emergent concerns, or for new/worsening symptoms.   

## 2017-05-01 NOTE — ED Notes (Signed)
ED Provider at bedside. 

## 2017-05-01 NOTE — ED Provider Notes (Signed)
[facility] Emergency Department Provider Note  ____________________________________________  Time seen: Approximately 7:10 PM  I have reviewed the triage vital signs and the nursing notes.   HISTORY  Chief Complaint Laceration    HPI Steve Bell is a 37 y.o. male that presents to the emergency department for evaluation of left thumb laceration.  He can bend his thumb but with pain. No additional injuries.  Tetanus shot is out of date. No numbness, tingling.    Past Medical History:  Diagnosis Date  . Hypertension     There are no active problems to display for this patient.   History reviewed. No pertinent surgical history.  Prior to Admission medications   Medication Sig Start Date End Date Taking? Authorizing Provider  amLODipine (NORVASC) 5 MG tablet Take 5 mg by mouth daily.    [provider]  amoxicillin (AMOXIL) 500 MG tablet Take 1 tablet (500 mg total) by mouth 2 (two) times daily. 03/02/15   Beers, Charmayne Sheerharles M, PA-C  cephALEXin (KEFLEX) 500 MG capsule Take 1 capsule (500 mg total) by mouth 4 (four) times daily for 10 days. 05/01/17 05/11/17  Enid DerryWagner, Lya Holben, PA-C  HYDROcodone-acetaminophen (NORCO) 5-325 MG tablet Take 1-2 tablets by mouth every 4 (four) hours as needed for moderate pain. 03/02/15   Beers, Charmayne Sheerharles M, PA-C  ibuprofen (ADVIL,MOTRIN) 800 MG tablet Take 1 tablet (800 mg total) by mouth every 8 (eight) hours as needed. 05/01/17   Enid DerryWagner, Sokha Craker, PA-C    Allergies Patient has no known allergies.  No family history on file.  Social History Social History   Tobacco Use  . Smoking status: Never Smoker  . Smokeless tobacco: Never Used  Substance Use Topics  . Alcohol use: Yes    Frequency: Never  . Drug use: No     Review of Systems  Gastrointestinal:  No nausea, no vomiting.  Musculoskeletal: Positive for thumb laceration Skin: Negative for rash, ecchymosis. Positive for laceration. Neurological: Negative for  numbness or tingling   ____________________________________________   PHYSICAL EXAM:  VITAL SIGNS: ED Triage Vitals  Enc Vitals Group     BP 05/01/17 1839 (!) 160/89     Pulse Rate 05/01/17 1839 (!) 129     Resp 05/01/17 1839 (!) 24     Temp 05/01/17 1839 98 F (36.7 C)     Temp Source 05/01/17 1839 Oral     SpO2 05/01/17 1839 94 %     Weight 05/01/17 1840 (!) 320 lb (145.2 kg)     Height 05/01/17 1840 5\' 9"  (1.753 m)     Head Circumference --      Peak Flow --      Pain Score 05/01/17 1840 10     Pain Loc --      Pain Edu? --      Excl. in GC? --      Constitutional: Alert and oriented. Well appearing and in no acute distress. Eyes: Conjunctivae are normal. PERRL. EOMI. Head: Atraumatic. ENT:      Ears:      Nose: No congestion/rhinnorhea.      Mouth/Throat: Mucous membranes are moist.  Neck: No stridor.   Cardiovascular: Good peripheral circulation. Respiratory: Good air entry to the bases with no decreased or absent breath sounds. Musculoskeletal: Full range of motion to all extremities. No gross deformities appreciated. Neurologic:  Normal speech and language. No gross focal neurologic deficits are appreciated.  Skin:  Skin is warm, dry. 2 cm curved flap laceration  to distal left thumb along nail   ____________________________________________   LABS (all labs ordered are listed, but only abnormal results are displayed)  Labs Reviewed - No data to display ____________________________________________  EKG   ____________________________________________  RADIOLOGY  No results found.  ____________________________________________    PROCEDURES  Procedure(s) performed:    Procedures  LACERATION REPAIR  Consent: Verbal consent obtained.  Consent given by: patient  Prepped and Draped in normal sterile fashion  Wound explored: No foreign bodies   Laceration Location: distal thumb  Laceration Length: 2 cm  Anesthesia: None  Local  anesthetic: lidocaine 1% without epinephrine  Anesthetic total: 6 ml  Irrigation method: syringe  Amount of cleaning: normal saline  Skin closure: 4-0 nylon  Number of sutures: 7  Technique: Simple interrupted  Patient tolerance: Patient tolerated the procedure well with no immediate complications.  Medications  lidocaine (PF) (XYLOCAINE) 1 % injection 5 mL (5 mLs Intradermal Given 05/01/17 1933)  Tdap (BOOSTRIX) injection 0.5 mL (0.5 mLs Intramuscular Given 05/01/17 1933)  oxyCODONE-acetaminophen (PERCOCET/ROXICET) 5-325 MG per tablet 1 tablet (1 tablet Oral Given 05/01/17 2057)  bacitracin 500 UNIT/GM ointment (1 application  Given 05/01/17 2059)     ____________________________________________   INITIAL IMPRESSION / ASSESSMENT AND PLAN / ED COURSE  Pertinent labs & imaging results that were available during my care of the patient were reviewed by me and considered in my medical decision making (see chart for details).  Review of the [place] CSRS was performed in accordance of the [facility] prior to dispensing any controlled drugs.   Patient's diagnosis is consistent with thumb laceration. Vital signs and exam are reassuring. Laceration was repaired with stitches by student. Tetanus shot was updated. Patient will be discharged home with prescriptions for keflex and ibuprofen. Patient is to follow up with PCP as directed. Patient is given ED precautions to return to the ED for any worsening or new symptoms.   ____________________________________________  FINAL CLINICAL IMPRESSION(S) / ED DIAGNOSES  Final diagnoses:  Laceration of left thumb without foreign body, nail damage status unspecified, initial encounter      NEW MEDICATIONS STARTED DURING THIS VISIT:  ED Discharge Orders        Ordered    cephALEXin (KEFLEX) 500 MG capsule  4 times daily     05/01/17 2039    ibuprofen (ADVIL,MOTRIN) 800 MG tablet  Every 8 hours PRN     05/01/17 2039          This chart was  dictated using voice recognition software/Dragon. Despite best efforts to proofread, errors can occur which can change the meaning. Any change was purely unintentional.    Enid Derry, PA-C 05/01/17 2359    Don Perking, [place], MD 05/04/17 1213

## 2017-05-01 NOTE — ED Triage Notes (Signed)
Pt states he cut his left thumb on the hedge trimmers. Bleeding is controlled.

## 2019-11-21 IMAGING — CR DG HIP (WITH OR WITHOUT PELVIS) 2-3V*L*
3 series · 3 of 3 positions shown · non-contrast
Comparison: None.

CLINICAL DATA: Left hip pain without trauma.

EXAM:
DG HIP (WITH OR WITHOUT PELVIS) 2-3V LEFT

[pelvis ap (1 of 2)]
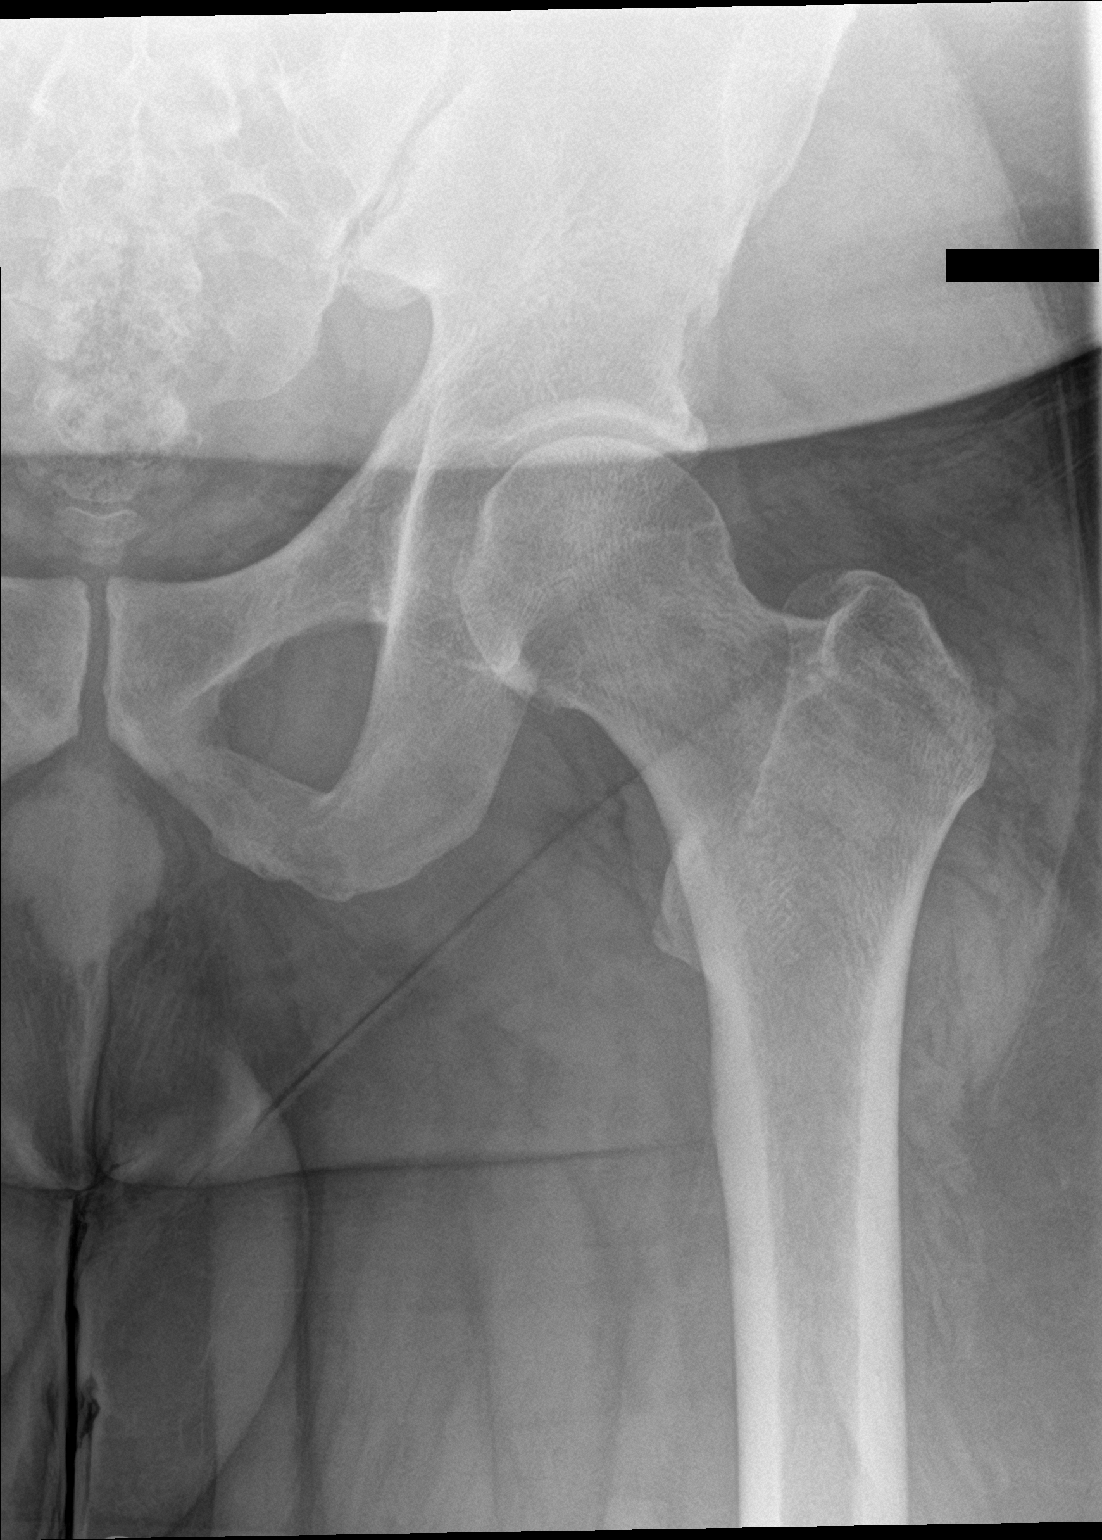

[hip lat]
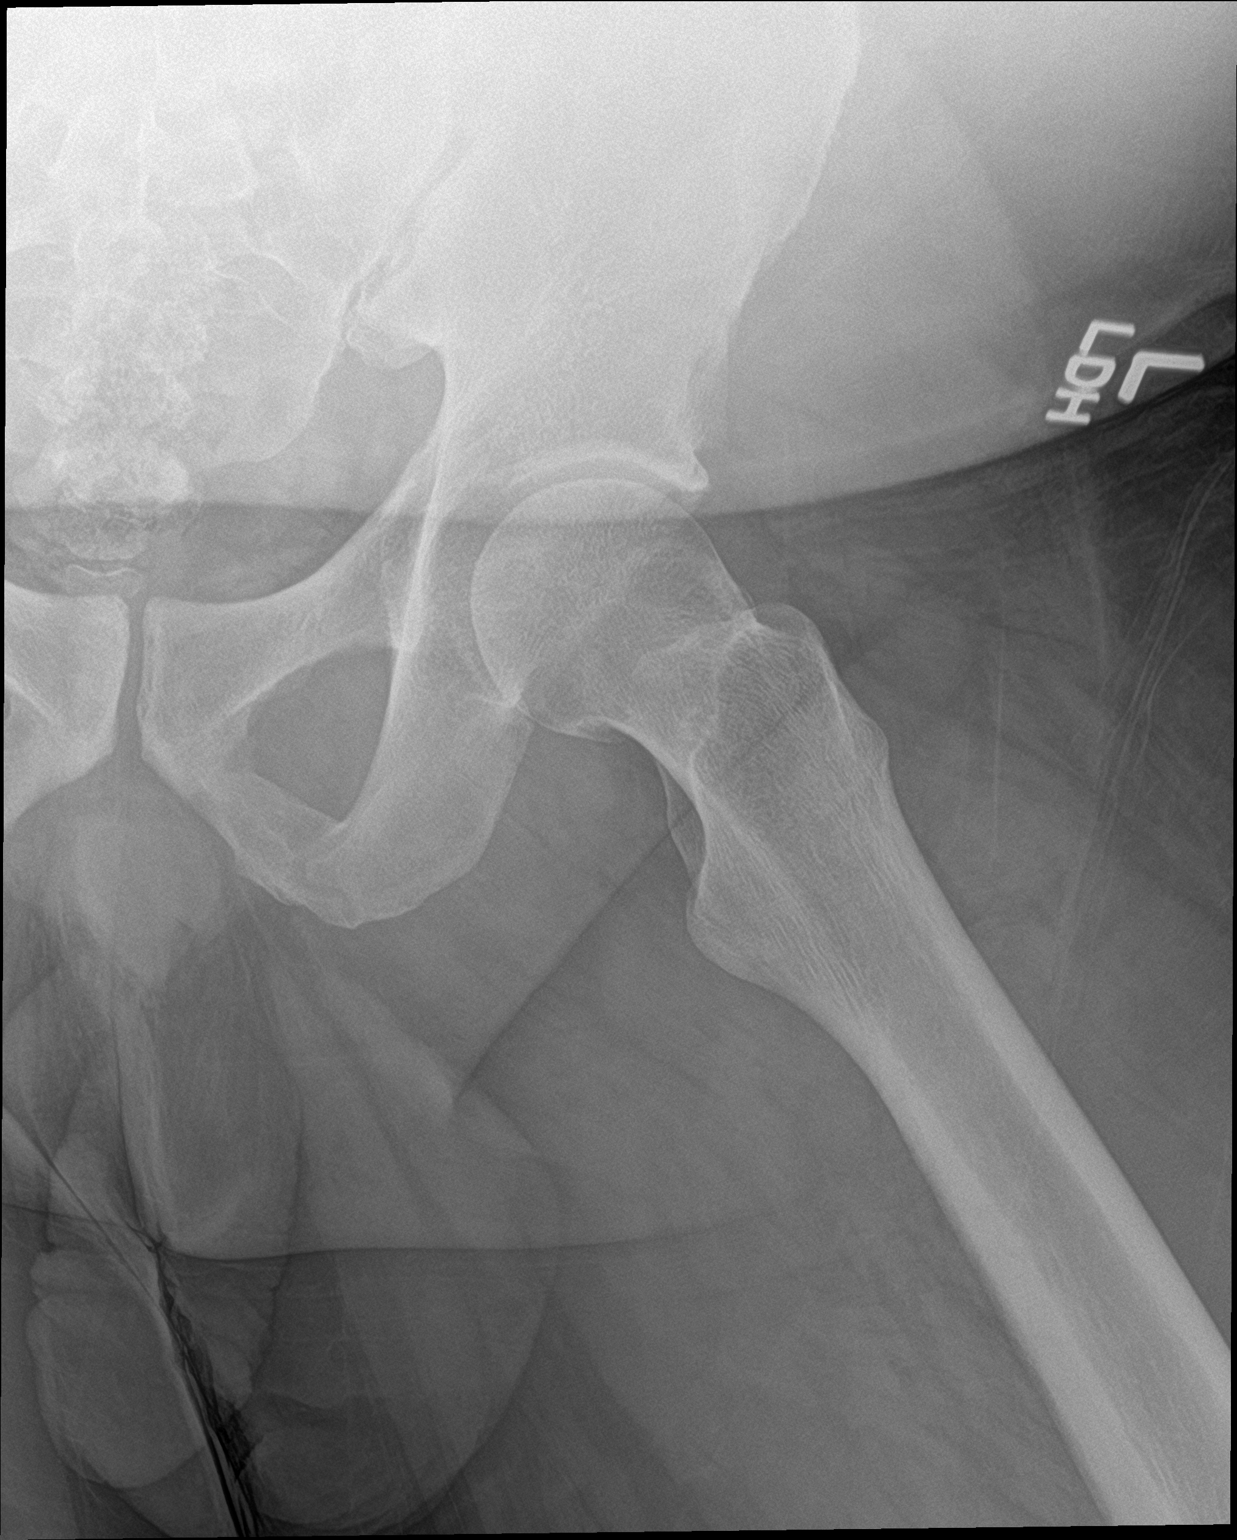

[pelvis ap (2 of 2)]
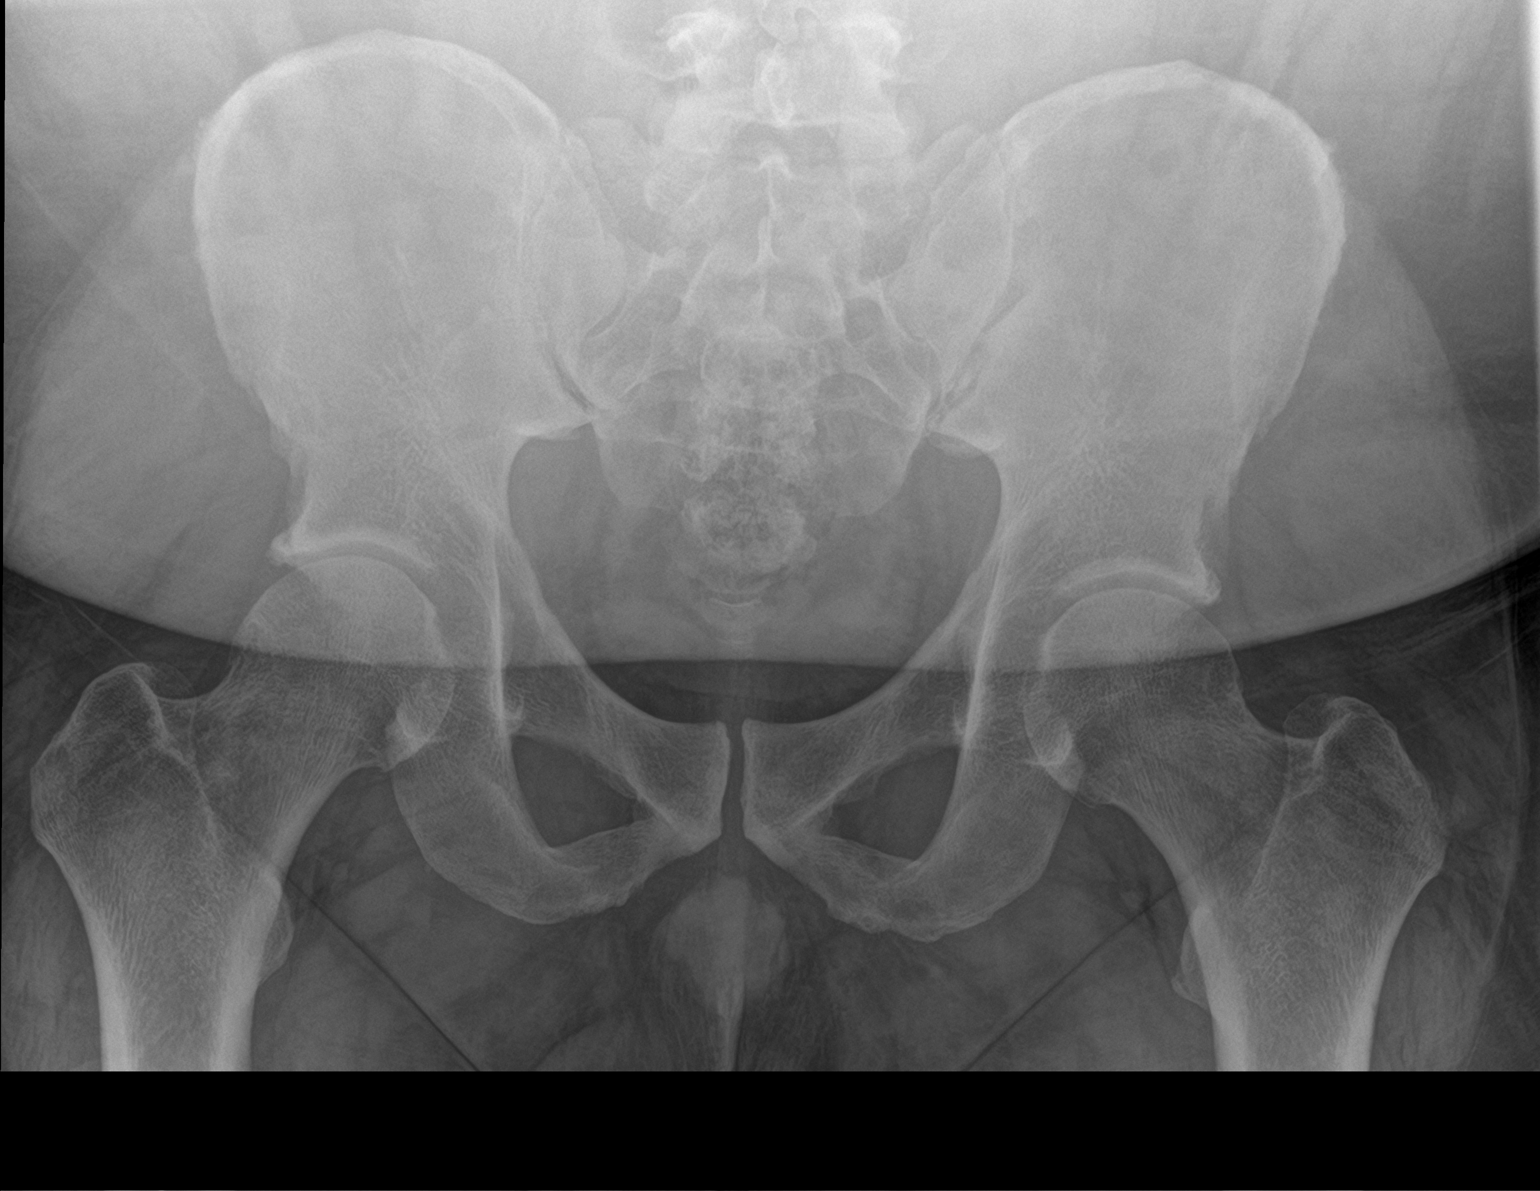

[3 of 3 positions shown; findings below may reference images not displayed]

FINDINGS: AP view the pelvis and AP/frog leg views of the left hip. Femoral
heads are located. Sacroiliac joints are symmetric. No acute
fracture. No focal osseous lesion. Joint spaces maintained.
IMPRESSION: No acute osseous abnormality.
